# Patient Record
Sex: Female | Born: 1975 | Race: White | Hispanic: No | Marital: Married | Smoking: Current every day smoker
Health system: Southern US, Community
[De-identification: ages and names within clinical notes are randomized; demographics above are authoritative.]

## PROBLEM LIST (undated history)

## (undated) HISTORY — PX: ABDOMINAL HYSTERECTOMY: SHX81

## (undated) HISTORY — PX: APPENDECTOMY: SHX54

---

## 2007-08-11 ENCOUNTER — Emergency Department: Payer: Self-pay | Admitting: Emergency Medicine

## 2018-04-22 ENCOUNTER — Other Ambulatory Visit: Payer: Self-pay

## 2018-04-22 ENCOUNTER — Emergency Department: Payer: Self-pay

## 2018-04-22 ENCOUNTER — Encounter: Payer: Self-pay | Admitting: Emergency Medicine

## 2018-04-22 ENCOUNTER — Emergency Department
Admission: EM | Admit: 2018-04-22 | Discharge: 2018-04-22 | Disposition: A | Discharge status: Home / Self Care | Payer: Self-pay | Attending: Student in an Organized Health Care Education/Training Program | Admitting: Student in an Organized Health Care Education/Training Program

## 2018-04-22 DIAGNOSIS — F17210 Nicotine dependence, cigarettes, uncomplicated: Secondary | ICD-10-CM | POA: Insufficient documentation

## 2018-04-22 DIAGNOSIS — M542 Cervicalgia: Secondary | ICD-10-CM

## 2018-04-22 DIAGNOSIS — M5412 Radiculopathy, cervical region: Secondary | ICD-10-CM | POA: Insufficient documentation

## 2018-04-22 DIAGNOSIS — R079 Chest pain, unspecified: Secondary | ICD-10-CM | POA: Insufficient documentation

## 2018-04-22 LAB — TROPONIN I: Troponin I: <0.03 ng/mL (ref <0.03)

## 2018-04-22 LAB — BASIC METABOLIC PANEL
ANION GAP: 8 (ref 5–15)
BUN: 14 mg/dL (ref 6–20)
CHLORIDE: 107 mmol/L (ref 98–111)
CO2: 24 mmol/L (ref 22–32)
Calcium: 8.7 mg/dL — ABNORMAL LOW (ref 8.9–10.3)
Creatinine, Ser: 0.93 mg/dL (ref 0.44–1.00)
GFR calc Af Amer: >60 mL/min (ref >60)
Glucose, Bld: 116 mg/dL — ABNORMAL HIGH (ref 70–99)
POTASSIUM: 3.8 mmol/L (ref 3.5–5.1)
Sodium: 139 mmol/L (ref 135–145)

## 2018-04-22 LAB — CBC
HEMATOCRIT: 40.8 % (ref 35.0–47.0)
HEMOGLOBIN: 14 g/dL (ref 12.0–16.0)
MCH: 31.3 pg (ref 26.0–34.0)
MCHC: 34.3 g/dL (ref 32.0–36.0)
MCV: 91.2 fL (ref 80.0–100.0)
Platelets: 245 10*3/uL (ref 150–440)
RBC: 4.48 MIL/uL (ref 3.80–5.20)
RDW: 13.5 % (ref 11.5–14.5)
WBC: 7 10*3/uL (ref 3.6–11.0)

## 2018-04-22 MED ORDER — IOPAMIDOL (ISOVUE-370) INJECTION 76%
125.0000 mL | Freq: Once | INTRAVENOUS | Status: AC | PRN
  Administered 2018-04-22: 125 mL via INTRAVENOUS
  Filled 2018-04-22: qty 125

## 2018-04-22 MED ORDER — PREDNISONE 10 MG PO TABS: 10.0000 mg | ORAL_TABLET | Freq: Every day | ORAL | 0 refills | Status: AC

## 2018-04-22 MED ORDER — HYDROCODONE-ACETAMINOPHEN 5-325 MG PO TABS: 1.0000 | ORAL_TABLET | ORAL | 0 refills | Status: AC | PRN

## 2018-04-22 MED ORDER — ONDANSETRON HCL 4 MG/2ML IJ SOLN
4.0000 mg | Freq: Once | INTRAMUSCULAR | Status: AC
  Administered 2018-04-22: 4 mg via INTRAVENOUS
  Filled 2018-04-22: qty 2

## 2018-04-22 MED ORDER — SODIUM CHLORIDE 0.9 % IV BOLUS
500.0000 mL | Freq: Once | INTRAVENOUS | Status: AC
  Administered 2018-04-22: 500 mL via INTRAVENOUS

## 2018-04-22 MED ORDER — FENTANYL CITRATE (PF) 100 MCG/2ML IJ SOLN
100.0000 ug | INTRAMUSCULAR | Status: DC | PRN
  Administered 2018-04-22: 100 ug via INTRAVENOUS
  Filled 2018-04-22: qty 2
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
## 2018-04-22 NOTE — ED Triage Notes (Signed)
Pt here with c/o right sided pain in the back of her neck, radiating to right shoulder, chest and arm. Denies injury, denies cough, states the pain began 2 or 3 days ago. Has tried ibuprofen with no relief.

## 2018-04-22 NOTE — ED Notes (Signed)
Patient transported to MRI 

## 2019-09-09 IMAGING — CT CT ANGIO NECK
2 of 7 series · 8 of 33 positions shown · IV contrast (APPLIED)
Comparison: None.

CLINICAL DATA: Right sided neck pain beginning 2-3 days ago.

EXAM:
CT ANGIOGRAPHY NECK
TECHNIQUE: Multidetector CT imaging of the neck was performed using the
standard protocol during bolus administration of intravenous
contrast. Multiplanar CT image reconstructions and MIPs were
obtained to evaluate the vascular anatomy. Carotid stenosis
measurements (when applicable) are obtained utilizing NASCET
criteria, using the distal internal carotid diameter as the
denominator.
CONTRAST:  125mL D7BYIT-L4H IOPAMIDOL (D7BYIT-L4H) INJECTION 76%

[Series 6: cta neck · axial · 0.39mm/px · z∈[-296,-220]mm · 2 of 115 slices shown]
[im 39/115  soft-tissue]
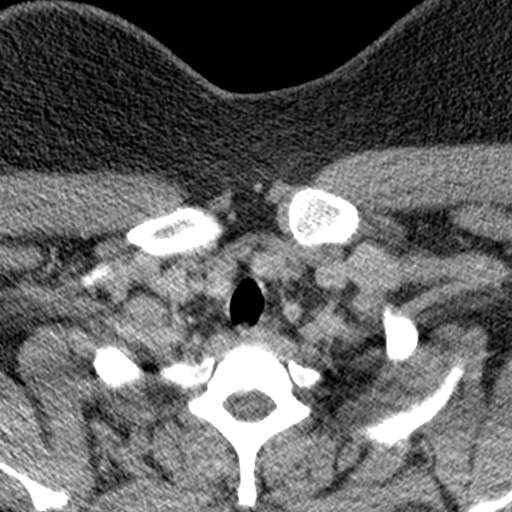
[im 77/115  soft-tissue]
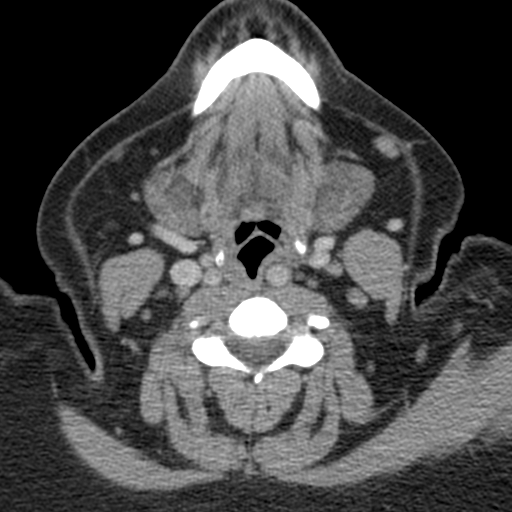

[Series 8: ax thin · axial · 0.39mm/px · z∈[-360,-192]mm · 6 of 240 slices shown]
[im 35/240  soft-tissue]
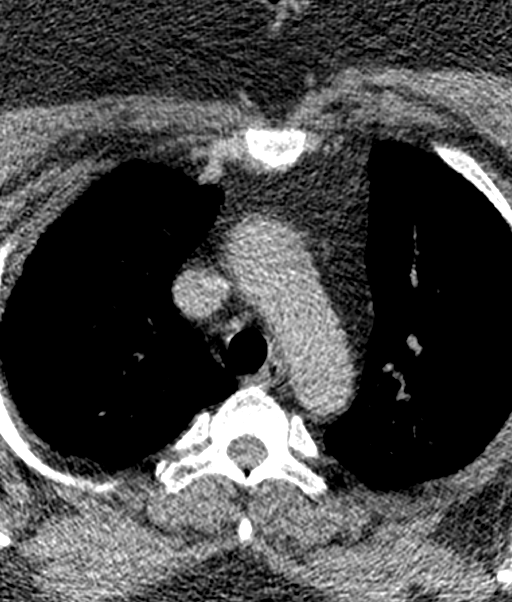
[im 69/240  bone]
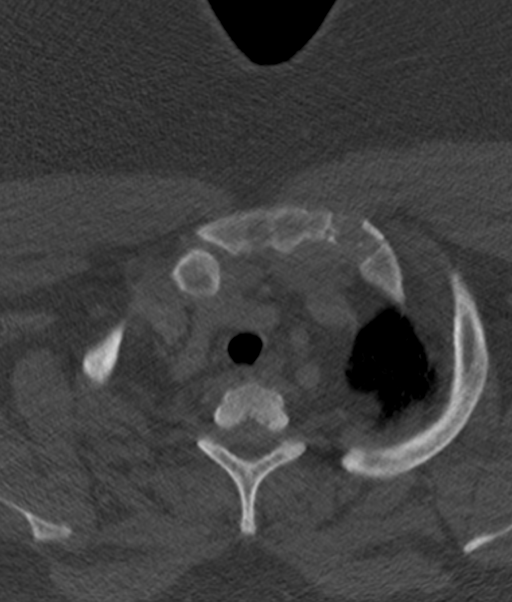
[im 103/240  soft-tissue]
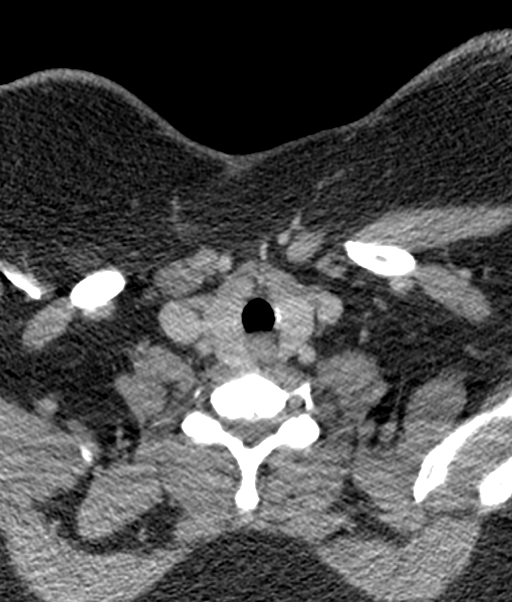
[im 137/240  bone]
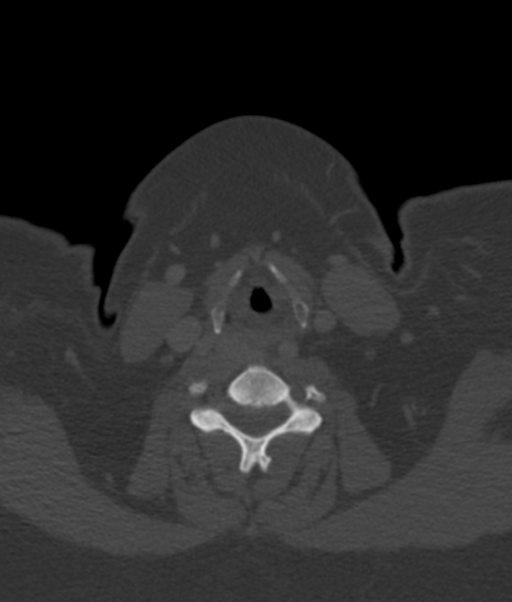
[im 171/240  soft-tissue]
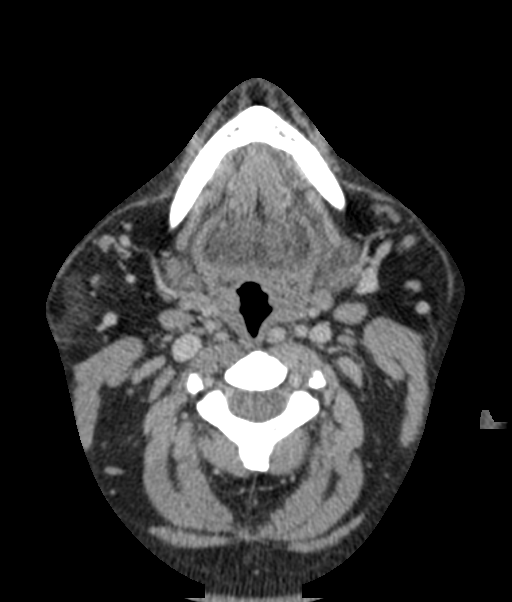
[im 205/240  bone]
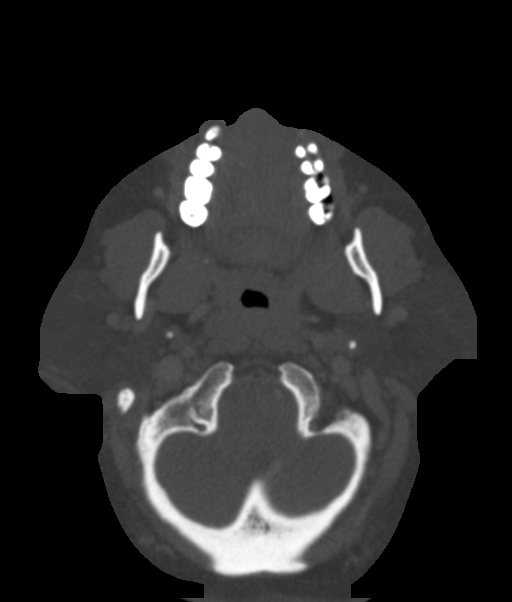

[8 of 33 positions shown; findings below may reference images not displayed]

FINDINGS: Aortic arch: Contrast opacity is very limited. The study is of
reduced diagnostic utility. See results of chest exam for evaluation
of the aorta.

Right carotid system: With limited contrast opacity, study is of
limited value. There is no evidence of atherosclerotic
calcification.

Left carotid system: With limited contrast opacity, the study is of
limited value. There is no evidence of atherosclerotic
calcification.

Vertebral arteries: Vertebral arteries cannot be assessed beyond the
absence of atherosclerotic calcification.

Skeleton: Ordinary cervical spondylosis.

Other neck: No soft tissue mass. Ordinary benign appearing lymph
nodes.

Upper chest: See results of chest CT.
IMPRESSION: Very limited contrast opacification. Vascular evaluation is not
possible, beyond establishing the absence of atherosclerotic
calcification.

## 2019-09-09 IMAGING — MR MR CERVICAL SPINE W/O CM
5 series · 36 of 48 positions shown · non-contrast
Comparison: None.

CLINICAL DATA: Initial evaluation for chronic right-sided neck pain
radiating into shoulder.

EXAM:
MRI CERVICAL SPINE WITHOUT CONTRAST
TECHNIQUE: Multiplanar, multisequence MR imaging of the cervical spine was
performed. No intravenous contrast was administered.

[Series 5: T2 · sagittal · 3.0mm · 0.62mm/px · 6 of 15 slices shown (1 of 2)]
[im 1/15]
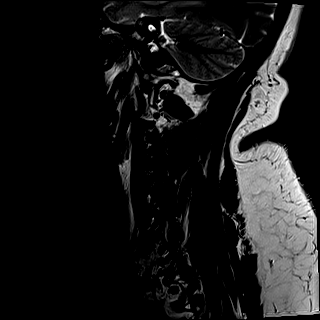
[im 3/15]
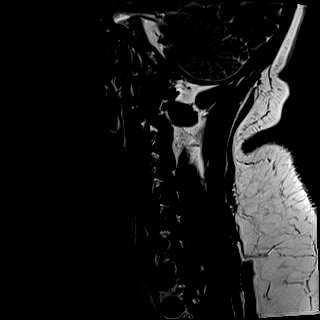
[im 6/15]
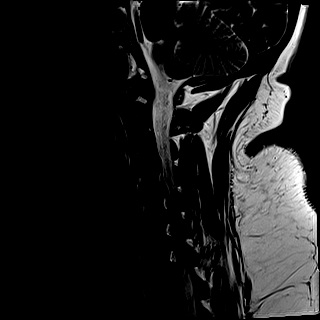
[im 9/15]
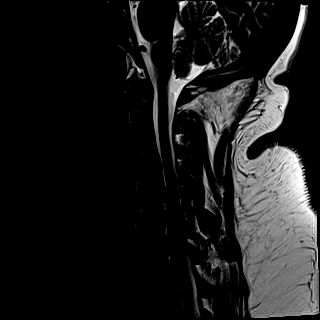
[im 12/15]
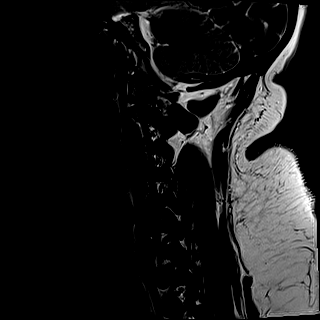
[im 15/15]
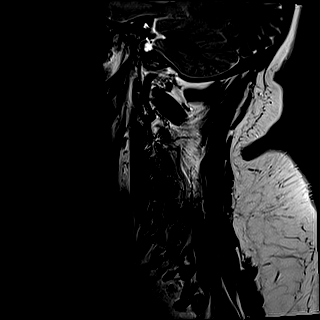

[Series 6: FLAIR · sagittal · 3.0mm · 0.78mm/px · 7 of 15 slices shown]
[im 1/15]
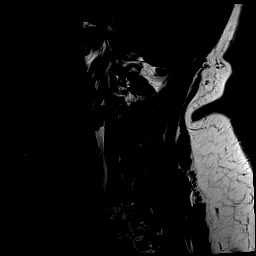
[im 3/15]
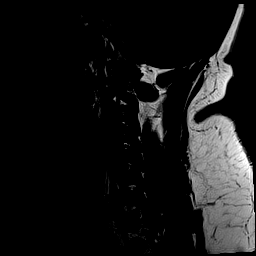
[im 5/15]
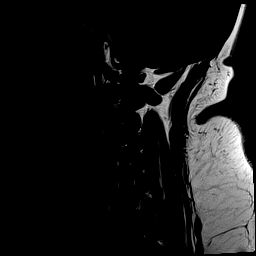
[im 8/15]
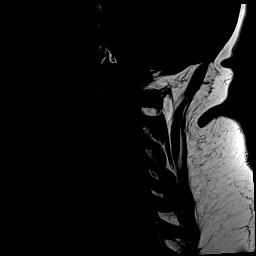
[im 10/15]
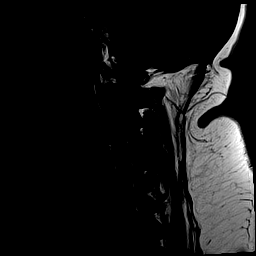
[im 12/15]
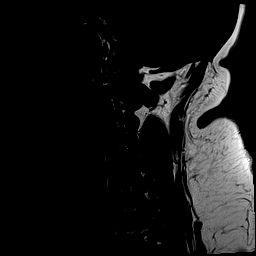
[im 15/15]
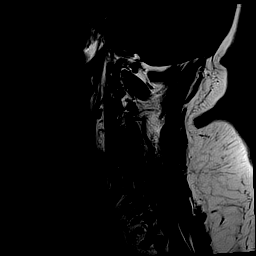

[Series 7: STIR · sagittal · 3.0mm · 0.62mm/px · 7 of 15 slices shown]
[im 1/15]
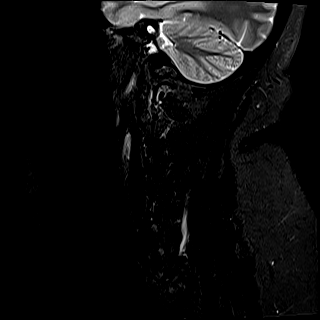
[im 3/15]
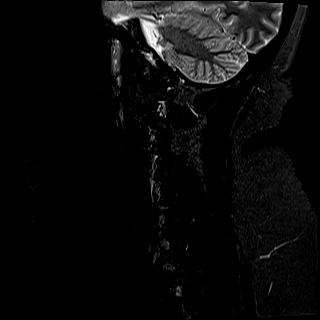
[im 5/15]
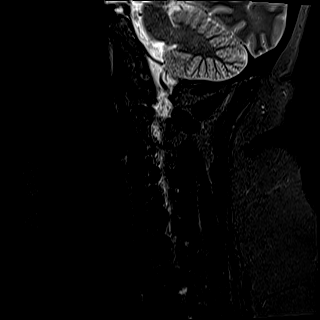
[im 8/15]
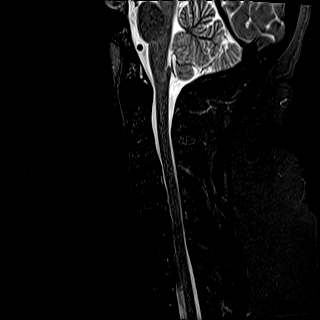
[im 10/15]
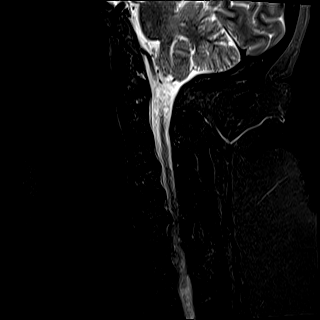
[im 12/15]
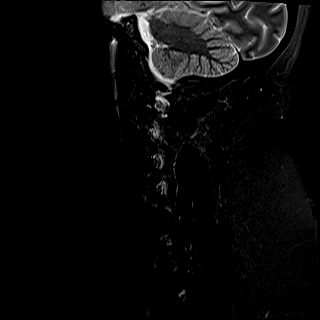
[im 15/15]
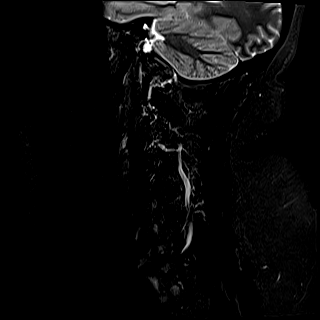

[Series 8: T2 · axial · 3.0mm · 0.70mm/px · z∈[-94,+3]mm · 8 of 29 slices shown (2 of 2)]
[im 1/29]
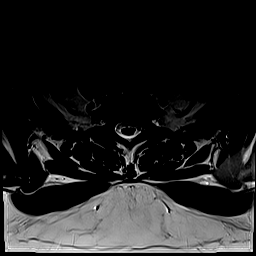
[im 5/29]
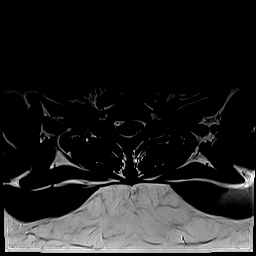
[im 9/29]
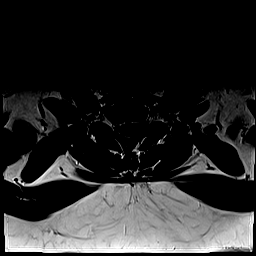
[im 13/29]
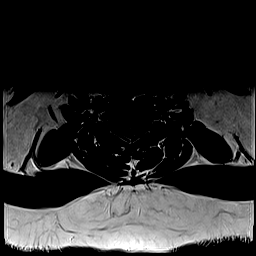
[im 16/29]
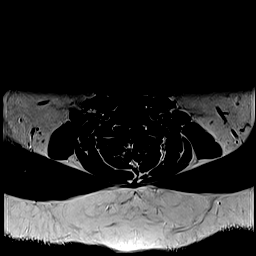
[im 20/29]
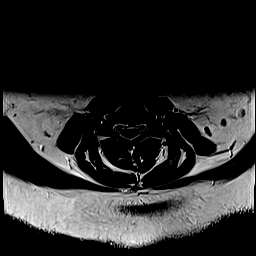
[im 24/29]
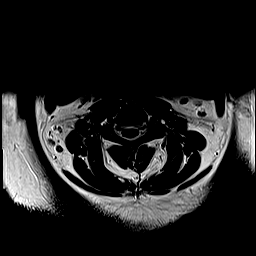
[im 29/29]
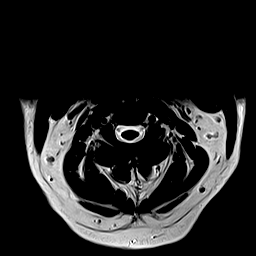

[Series 9: ax mpgr · axial · 3.0mm · 0.35mm/px · z∈[-94,+3]mm · 8 of 29 slices shown]
[im 1/29]
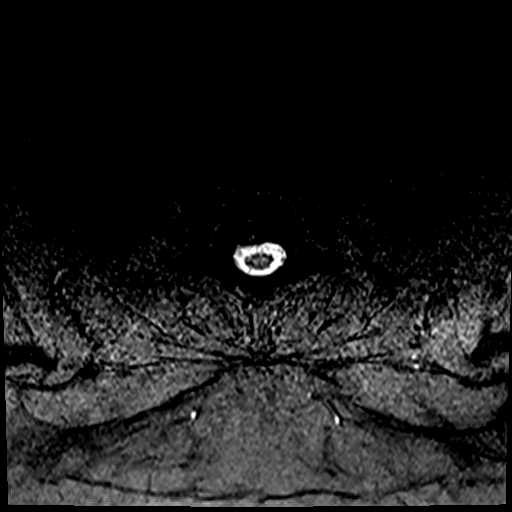
[im 5/29]
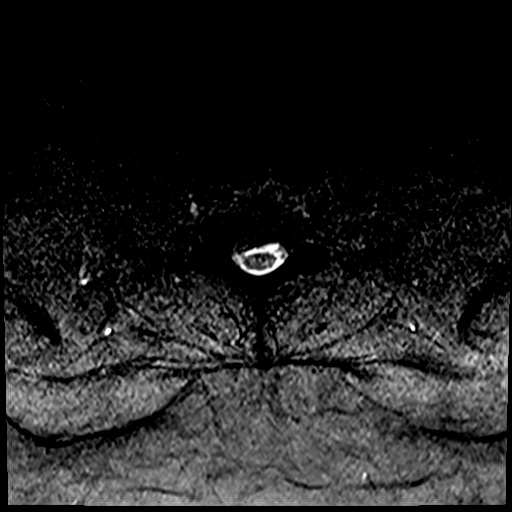
[im 9/29]
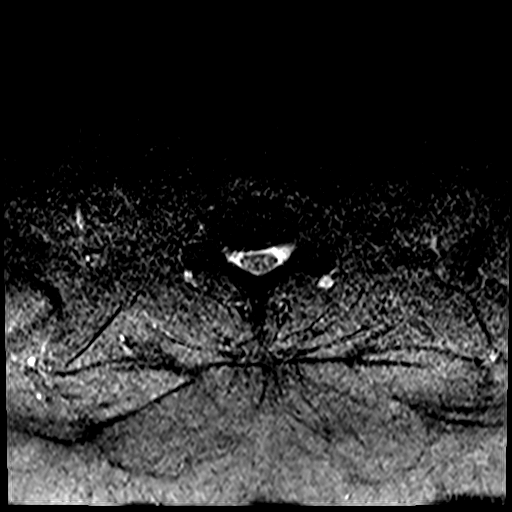
[im 13/29]
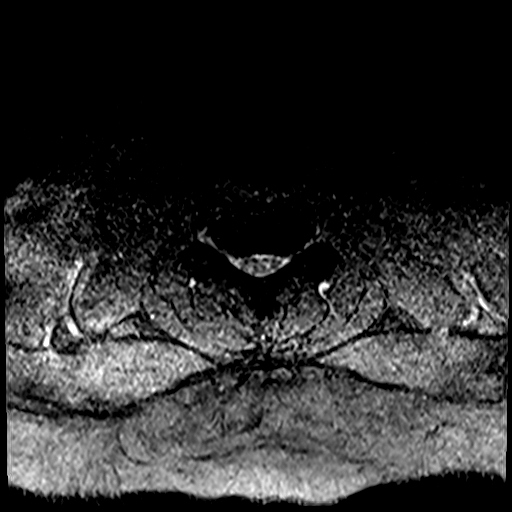
[im 16/29]
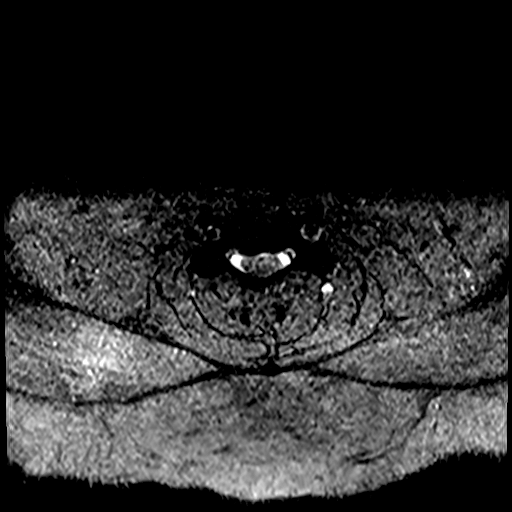
[im 20/29]
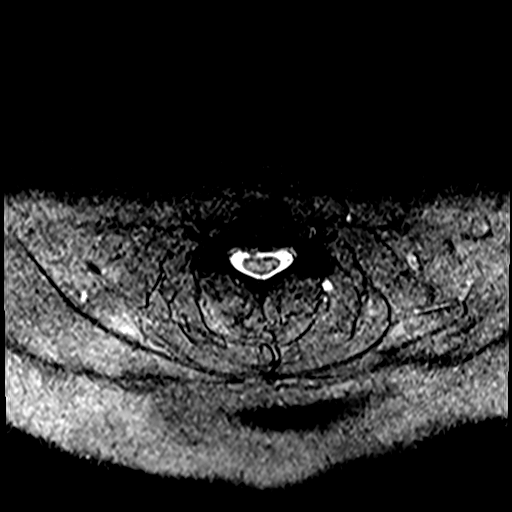
[im 24/29]
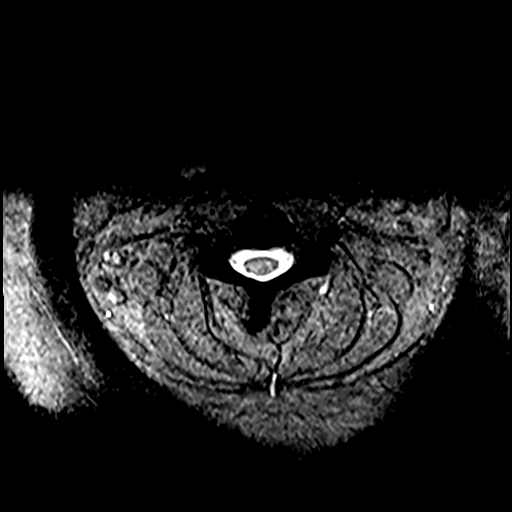
[im 29/29]
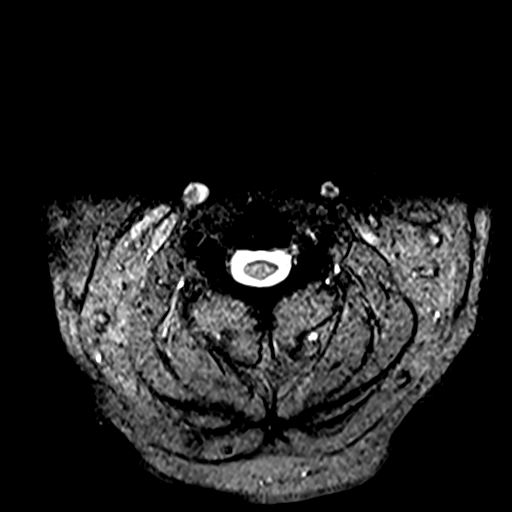

[36 of 48 positions shown; findings below may reference images not displayed]

FINDINGS: Alignment: Straightening of the normal cervical lordosis. No
listhesis.

Vertebrae: Vertebral body heights maintained without evidence for
acute or chronic fracture. Bone marrow signal intensity normal. No
discrete or worrisome osseous lesions. No abnormal marrow edema.

Cord: Signal intensity within the cervical spinal cord is normal.

Posterior Fossa, vertebral arteries, paraspinal tissues: Visualized
brain and posterior fossa within normal limits. Craniocervical
junction normal. Paraspinous and prevertebral soft tissues are
normal. Normal intravascular flow voids present within the vertebral
arteries bilaterally.

Disc levels:

C2-C3: Unremarkable.

C3-C4: Diffuse disc bulge with bilateral uncovertebral hypertrophy.
Flattening of the ventral CSF with mild spinal stenosis. Foramina
remain patent.

C4-C5: Diffuse disc bulge with bilateral uncovertebral hypertrophy.
Broad posterior component flattens the ventral CSF, asymmetric to
the right. Mild cord flattening without cord signal changes.
Moderate spinal stenosis. Mild right C5 foraminal narrowing.

C5-C6: Chronic diffuse degenerative disc osteophyte with
intervertebral disc space narrowing. Broad posterior component
flattens the ventral CSF. Secondary mild cord flattening without
cord signal changes. Moderate spinal stenosis. Moderate bilateral C6
foraminal narrowing.

C6-C7: Broad right paracentral disc protrusion extending into the
right neural foramen (series 8, image 22) mild spinal stenosis.
Moderate to severe right C7 foraminal narrowing.

C7-T1: Lobulated right paracentral/foraminal disc protrusion indents
the right ventral thecal sac, extending into the right neural
foramen (series 8, image 27). Mild spinal stenosis with moderate to
severe right foraminal narrowing. Right C8 nerve root could be
affected.

Visualized upper thoracic spine within normal limits.
IMPRESSION: 1. Right paracentral/foraminal disc protrusions at C6-7 and C7-T1
with resultant mild spinal stenosis and moderate to severe right
neural foraminal narrowing. The right C7 or C8 nerve roots could be
affected respectively.
2. Degenerative disc osteophyte at C5-6 with resultant moderate
canal and bilateral C6 foraminal narrowing.
3. Disc bulging at C3-4 and C4-5 with resultant mild spinal
stenosis.

## 2022-04-11 ENCOUNTER — Ambulatory Visit: Payer: Medicaid Other

## 2022-05-25 ENCOUNTER — Other Ambulatory Visit: Payer: Self-pay

## 2022-05-25 DIAGNOSIS — Z1231 Encounter for screening mammogram for malignant neoplasm of breast: Secondary | ICD-10-CM

## 2022-05-30 ENCOUNTER — Other Ambulatory Visit: Payer: Self-pay

## 2022-05-30 ENCOUNTER — Ambulatory Visit
Admission: RE | Admit: 2022-05-30 | Discharge: 2022-05-30 | Disposition: A | Discharge status: Home / Self Care | Payer: Medicaid Other | Source: Ambulatory Visit | Attending: Obstetrics and Gynecology | Admitting: Obstetrics and Gynecology

## 2022-05-30 ENCOUNTER — Ambulatory Visit: Payer: Medicaid Other | Attending: Hematology and Oncology | Admitting: Hematology and Oncology

## 2022-05-30 VITALS — BP 141/91 | Wt 301.1 lb

## 2022-05-30 DIAGNOSIS — Z1211 Encounter for screening for malignant neoplasm of colon: Secondary | ICD-10-CM

## 2022-05-30 DIAGNOSIS — Z1231 Encounter for screening mammogram for malignant neoplasm of breast: Secondary | ICD-10-CM | POA: Insufficient documentation

## 2022-05-30 NOTE — Patient Instructions (Signed)
[place] about breast self awareness. Patient did not need a Pap smear today due to hysterectomy in 2013 for benign reasons. She will no longer need pap smears.  [facility] Appointment scheduled for 05/30/22. Patient aware of appointment and will be there. Let patient know will follow up with her within the next couple weeks with results.She will return to clinic in one year for repeat mammogram.  Nyra [place] verbalized understanding.  Melodye Ped, NP 11:23 AM

## 2022-05-30 NOTE — Progress Notes (Signed)
Ms. MINERVIA OSSO is a 46 y.o. female who presents to [facility] clinic today with no complaints.    Pap Smear: Pap not smear completed today. Patient had hysterectomy in 2013 for benign reasons. She has no history of abnormal paps and will not need future pap smears.    Physical exam: Breasts Breasts symmetrical. No skin abnormalities bilateral breasts. No nipple retraction bilateral breasts. No nipple discharge bilateral breasts. No lymphadenopathy. No lumps palpated bilateral breasts.       Pelvic/Bimanual Pap is not indicated today    Smoking History: Patient has is a current smoker at 1/2 packs per day and was referred to quit line.    Patient Navigation: Patient education provided. Access to services provided for patient through [facility] program. No interpreter provided. No transportation provided   Colorectal Cancer Screening: Per patient has never had colonoscopy completed No complaints today. FIT test given.    Breast and Cervical Cancer Risk Assessment: Patient does not have family history of breast cancer, known genetic mutations, or radiation treatment to the chest before age 55. Patient does not have history of cervical dysplasia, immunocompromised, or DES exposure in-utero.  Risk Assessment     Risk Scores       05/30/2022   Last edited by: Demetrius Revel, LPN   5-year risk: 0.6 %   Lifetime risk: 6.2 %            A: [facility] exam without pap smear No complaints with benign exam.   P: [facility]. Appointment scheduled 05/30/22.  Dayton Scrape A, NP 05/30/2022 11:18 AM  c

## 2023-05-21 ENCOUNTER — Other Ambulatory Visit: Payer: Self-pay | Admitting: Primary Care

## 2023-05-21 DIAGNOSIS — Z1231 Encounter for screening mammogram for malignant neoplasm of breast: Secondary | ICD-10-CM

## 2023-06-04 ENCOUNTER — Ambulatory Visit
Admission: RE | Admit: 2023-06-04 | Discharge: 2023-06-04 | Disposition: A | Discharge status: Home / Self Care | Payer: Medicaid Other | Source: Ambulatory Visit | Attending: Primary Care | Admitting: Primary Care

## 2023-06-04 DIAGNOSIS — Z1231 Encounter for screening mammogram for malignant neoplasm of breast: Secondary | ICD-10-CM | POA: Diagnosis present

## 2023-07-19 ENCOUNTER — Other Ambulatory Visit: Payer: Self-pay | Admitting: Orthopedic Surgery

## 2023-07-19 DIAGNOSIS — M51362 Other intervertebral disc degeneration, lumbar region with discogenic back pain and lower extremity pain: Secondary | ICD-10-CM

## 2023-07-19 DIAGNOSIS — M79605 Pain in left leg: Secondary | ICD-10-CM

## 2023-07-19 DIAGNOSIS — M47816 Spondylosis without myelopathy or radiculopathy, lumbar region: Secondary | ICD-10-CM

## 2023-07-23 ENCOUNTER — Encounter: Payer: Self-pay | Admitting: Orthopedic Surgery

## 2023-07-28 ENCOUNTER — Ambulatory Visit
Admission: RE | Admit: 2023-07-28 | Discharge: 2023-07-28 | Disposition: A | Discharge status: Home / Self Care | Payer: Medicaid Other | Source: Ambulatory Visit | Attending: Orthopedic Surgery | Admitting: Orthopedic Surgery

## 2023-07-28 DIAGNOSIS — M79604 Pain in right leg: Secondary | ICD-10-CM

## 2023-07-28 DIAGNOSIS — M51362 Other intervertebral disc degeneration, lumbar region with discogenic back pain and lower extremity pain: Secondary | ICD-10-CM

## 2023-07-28 DIAGNOSIS — M47816 Spondylosis without myelopathy or radiculopathy, lumbar region: Secondary | ICD-10-CM

## 2023-09-17 ENCOUNTER — Encounter: Payer: Self-pay | Admitting: Dermatology

## 2023-09-17 ENCOUNTER — Ambulatory Visit: Payer: Medicaid Other | Admitting: Dermatology

## 2023-09-17 DIAGNOSIS — D2239 Melanocytic nevi of other parts of face: Secondary | ICD-10-CM

## 2023-09-17 DIAGNOSIS — W908XXA Exposure to other nonionizing radiation, initial encounter: Secondary | ICD-10-CM

## 2023-09-17 DIAGNOSIS — D492 Neoplasm of unspecified behavior of bone, soft tissue, and skin: Secondary | ICD-10-CM | POA: Diagnosis not present

## 2023-09-17 DIAGNOSIS — L578 Other skin changes due to chronic exposure to nonionizing radiation: Secondary | ICD-10-CM | POA: Diagnosis not present

## 2023-09-17 DIAGNOSIS — L918 Other hypertrophic disorders of the skin: Secondary | ICD-10-CM | POA: Diagnosis not present

## 2023-09-17 NOTE — Patient Instructions (Addendum)

## 2023-09-17 NOTE — Progress Notes (Signed)
   New Patient Visit   Subjective  Melinda Mcdonald is a 48 y.o. female who presents for the following: check mole R cheek, 20 yrs, itchy, Skin tags neck, ~20 yrs, itchy The patient has spots, moles and lesions to be evaluated, some may be new or changing and the patient may have concern these could be cancer.  New patient referral from Sandrea Hughs, NP.  The following portions of the chart were reviewed this encounter and updated as appropriate: medications, allergies, medical history  Review of Systems:  No other skin or systemic complaints except as noted in HPI or Assessment and Plan.  Objective  Well appearing patient in no apparent distress; mood and affect are within normal limits.   A focused examination was performed of the following areas: Face, neck  Relevant exam findings are noted in the Assessment and Plan.  R mid cheek 5.26mm fleshy pap  R neck x 9, L neck x 2 (11) Fleshy pedunculated paps R > L neck  Assessment & Plan   ACTINIC DAMAGE - chronic, secondary to cumulative UV radiation exposure/sun exposure over time - diffuse scaly erythematous macules with underlying dyspigmentation - Recommend daily broad spectrum sunscreen SPF 30+ to sun-exposed areas, reapply every 2 hours as needed.  - Recommend staying in the shade or wearing long sleeves, sun glasses (UVA+UVB protection) and wide brim hats (4-inch brim around the entire circumference of the hat). - Call for new or changing lesions.     NEOPLASM OF SKIN R mid cheek Epidermal / dermal shaving  Lesion diameter (cm):  0.5 Informed consent: discussed and consent obtained   Patient was prepped and draped in usual sterile fashion: area prepped with alcohol. Anesthesia: the lesion was anesthetized in a standard fashion   Anesthetic:  1% lidocaine w/ epinephrine 1-100,000 buffered w/ 8.4% NaHCO3 Instrument used: flexible razor blade   Hemostasis achieved with: pressure, aluminum chloride and  electrodesiccation   Outcome: patient tolerated procedure well   Post-procedure details: wound care instructions given   Post-procedure details comment:  Ointment and small bandage applied Specimen 1 - Surgical pathology Differential Diagnosis: D48.5 Irritated nevus vs other  Check Margins: No 5.29mm fleshy pap Symptomatic, irritating, patient would like treated.  SKIN TAG (11) R neck x 9, L neck x 2 (11) Symptomatic, irritating, patient would like treated.  Epidermal / dermal shaving - R neck x 9, L neck x 2 (11)  Lesion diameter (cm):  0.2 Lesion diameter (cm) comment:  Multiple small fleshy pedunculated papules Informed consent: discussed and consent obtained   Anesthesia: the lesion was anesthetized in a standard fashion   Anesthetic:  1% lidocaine w/ epinephrine 1-100,000 buffered w/ 8.4% NaHCO3 Instrument used: scissors   Hemostasis achieved with: pressure, aluminum chloride and electrodesiccation   Outcome: patient tolerated procedure well   Post-procedure details: wound care instructions given    Return if symptoms worsen or fail to improve.  I, Ardis Rowan, RMA, am acting as scribe for Willeen Niece, MD .   Documentation: I have reviewed the above documentation for accuracy and completeness, and I agree with the above.  Willeen Niece, MD

## 2023-09-20 LAB — SURGICAL PATHOLOGY

## 2023-09-24 ENCOUNTER — Telehealth: Payer: Self-pay

## 2023-09-24 NOTE — Telephone Encounter (Signed)
 Advised patient biopsy of the right mid cheek was benign and no further treatment needed.

## 2023-09-24 NOTE — Telephone Encounter (Signed)
-----   Message from Artemio Larry sent at 09/24/2023 12:53 PM EST ----- 1. Skin, R mid cheek :       MELANOCYTIC NEVUS, INTRADERMAL TYPE, IRRITATED    Benign irritated mole- please call patient

## 2024-02-27 ENCOUNTER — Ambulatory Visit
Admission: RE | Admit: 2024-02-27 | Discharge: 2024-02-27 | Disposition: A | Discharge status: Home / Self Care | Source: Ambulatory Visit | Attending: Gastroenterology | Admitting: Gastroenterology

## 2024-02-27 ENCOUNTER — Other Ambulatory Visit: Payer: Self-pay | Admitting: Gastroenterology

## 2024-02-27 DIAGNOSIS — M542 Cervicalgia: Secondary | ICD-10-CM

## 2024-05-22 ENCOUNTER — Other Ambulatory Visit: Payer: Self-pay | Admitting: Primary Care

## 2024-05-22 DIAGNOSIS — Z1231 Encounter for screening mammogram for malignant neoplasm of breast: Secondary | ICD-10-CM

## 2024-06-24 ENCOUNTER — Ambulatory Visit
Admission: RE | Admit: 2024-06-24 | Discharge: 2024-06-24 | Disposition: A | Discharge status: Home / Self Care | Source: Ambulatory Visit | Attending: Primary Care | Admitting: Primary Care

## 2024-06-24 DIAGNOSIS — Z1231 Encounter for screening mammogram for malignant neoplasm of breast: Secondary | ICD-10-CM | POA: Diagnosis present

## 2024-06-30 ENCOUNTER — Ambulatory Visit
Admission: RE | Admit: 2024-06-30 | Discharge: 2024-06-30 | Disposition: A | Discharge status: Home / Self Care | Source: Ambulatory Visit | Attending: Primary Care | Admitting: Primary Care

## 2024-06-30 ENCOUNTER — Other Ambulatory Visit: Payer: Self-pay | Admitting: Primary Care

## 2024-06-30 DIAGNOSIS — M25561 Pain in right knee: Secondary | ICD-10-CM | POA: Insufficient documentation
